# Patient Record
Sex: Male | Born: 1985 | Race: White | Hispanic: No | Marital: Single | Smoking: Former smoker
Health system: Southern US, Community
[De-identification: ages and names within clinical notes are randomized; demographics above are authoritative.]

---

## 2009-03-08 ENCOUNTER — Encounter: Admission: RE | Admit: 2009-03-08 | Discharge: 2009-03-30 | Payer: Self-pay | Admitting: Sports Medicine

## 2014-08-31 ENCOUNTER — Telehealth: Payer: Self-pay | Admitting: Emergency Medicine

## 2014-08-31 ENCOUNTER — Encounter: Payer: Self-pay | Admitting: *Deleted

## 2014-08-31 ENCOUNTER — Emergency Department (INDEPENDENT_AMBULATORY_CARE_PROVIDER_SITE_OTHER): Payer: Self-pay

## 2014-08-31 ENCOUNTER — Emergency Department
Admission: EM | Admit: 2014-08-31 | Discharge: 2014-08-31 | Disposition: A | Discharge status: Home / Self Care | Payer: Self-pay | Source: Home / Self Care | Attending: Family Medicine | Admitting: Family Medicine

## 2014-08-31 DIAGNOSIS — M545 Low back pain, unspecified: Secondary | ICD-10-CM

## 2014-08-31 MED ORDER — MELOXICAM 15 MG PO TABS
15.0000 mg | ORAL_TABLET | Freq: Every day | ORAL | Status: AC
Start: 2014-08-31 — End: ?

## 2014-08-31 MED ORDER — METAXALONE 800 MG PO TABS: 800.0000 mg | ORAL_TABLET | Freq: Three times a day (TID) | ORAL | Status: AC

## 2014-08-31 NOTE — Discharge Instructions (Signed)
Apply ice pack for 30 to 40 minutes, 3 to 4 times daily  Continue until pain decreases.   Avoid lifting.  Begin back range of motion and stretching exercises in about five days.   Back Exercises Back exercises help treat and prevent back injuries. The goal of back exercises is to increase the strength of your abdominal and back muscles and the flexibility of your back. These exercises should be started when you no longer have back pain. Back exercises include:  Pelvic Tilt. Lie on your back with your knees bent. Tilt your pelvis until the lower part of your back is against the floor. Hold this position 5 to 10 sec and repeat 5 to 10 times.  Knee to Chest. Pull first 1 knee up against your chest and hold for 20 to 30 seconds, repeat this with the other knee, and then both knees. This may be done with the other leg straight or bent, whichever feels better.  Sit-Ups or Curl-Ups. Bend your knees 90 degrees. Start with tilting your pelvis, and do a partial, slow sit-up, lifting your trunk only 30 to 45 degrees off the floor. Take at least 2 to 3 seconds for each sit-up. Do not do sit-ups with your knees out straight. If partial sit-ups are difficult, simply do the above but with only tightening your abdominal muscles and holding it as directed.  Hip-Lift. Lie on your back with your knees flexed 90 degrees. Push down with your feet and shoulders as you raise your hips a couple inches off the floor; hold for 10 seconds, repeat 5 to 10 times.  Back arches. Lie on your stomach, propping yourself up on bent elbows. Slowly press on your hands, causing an arch in your low back. Repeat 3 to 5 times. Any initial stiffness and discomfort should lessen with repetition over time.  Shoulder-Lifts. Lie face down with arms beside your body. Keep hips and torso pressed to floor as you slowly lift your head and shoulders off the floor. Do not overdo your exercises, especially in the beginning. Exercises may cause you  some mild back discomfort which lasts for a few minutes; however, if the pain is more severe, or lasts for more than 15 minutes, do not continue exercises until you see your caregiver. Improvement with exercise therapy for back problems is slow.  See your caregivers for assistance with developing a proper back exercise program. Document Released: 06/01/2004 Document Revised: 07/17/2011 Document Reviewed: 02/23/2011 [facility] Patient Information 2015 [employer]. This information is not intended to replace advice given to you by your health care provider. Make sure you discuss any questions you have with your health care provider.

## 2014-08-31 NOTE — ED Notes (Signed)
Pt c/o LBP x last night after bending over to move some laundry. He took flexeril last night with some relief. Denies injury.

## 2014-08-31 NOTE — ED Provider Notes (Signed)
CSN: [id number]     Arrival date & time 08/31/14  0900 History   First MD Initiated Contact with Patient 08/31/14 [phone]     Chief Complaint  Patient presents with  . Back Pain     HPI Comments: While bending over to move some laundry yesterday evening, patient felt sudden sharp pain in his lower back.  The pain intermittently radiates to his left posterior thigh and knee.  The pain is worse with any movement, and better when he arches his back, or supine in bed.   He denies bowel or bladder dysfunction, and no saddle numbness.    Patient is a 29 y.o. male presenting with back pain. The history is provided by the patient.  Back Pain Location:  Lumbar spine Quality:  Aching Radiates to:  L posterior upper leg Pain severity:  Severe Pain is:  Same all the time Onset quality:  Sudden Duration:  12 hours Timing:  Constant Progression:  Unchanged Chronicity:  New Context comment:  Bending over Relieved by:  Muscle relaxants and lying down (arching back) Worsened by:  Movement Ineffective treatments:  None tried Associated symptoms: tingling   Associated symptoms: no abdominal pain, no bladder incontinence, no bowel incontinence, no dysuria, no fever, no leg pain, no numbness, no paresthesias, no pelvic pain, no perianal numbness and no weakness   Risk factors: obesity     History reviewed. No pertinent past medical history. History reviewed. No pertinent past surgical history. Family History  Problem Relation Age of Onset  . ALS Father   . Diabetes Father    History  Substance Use Topics  . Smoking status: Former Games developer  . Smokeless tobacco: Not on file  . Alcohol Use: Yes     Comment: 1 q wk    Review of Systems  Constitutional: Negative for fever.  Gastrointestinal: Negative for abdominal pain and bowel incontinence.  Genitourinary: Negative for bladder incontinence, dysuria and pelvic pain.  Musculoskeletal: Positive for back pain.  Neurological: Positive for tingling.  Negative for weakness, numbness and paresthesias.  All other systems reviewed and are negative.   Allergies  Review of patient's allergies indicates no known allergies.  Home Medications   Prior to Admission medications   Medication Sig Start Date End Date Taking? Authorizing Provider  meloxicam (MOBIC) 15 MG tablet Take 1 tablet (15 mg total) by mouth daily. Take with food each morning 08/31/14   Lattie Haw, MD  metaxalone (SKELAXIN) 800 MG tablet Take 1 tablet (800 mg total) by mouth 3 (three) times daily. 08/31/14   Lattie Haw, MD   BP 120/75 mmHg  Pulse 94  Temp(Src) 98.1 F (36.7 C) (Oral)  Resp 18  Ht  (1.676 m)  Wt 212 lb (96.163 kg)  BMI 34.23 kg/m2  SpO2 97% Physical Exam  Constitutional: He is oriented to person, place, and time. He appears well-developed and well-nourished. No distress.  Patient is obese (BMI 34.2)  HENT:  Head: Normocephalic.  Eyes: Pupils are equal, round, and reactive to light.  Neck: Normal range of motion.  Cardiovascular: Normal heart sounds.   Pulmonary/Chest: Breath sounds normal.  Abdominal: There is no tenderness.  Musculoskeletal:       Lumbar back: He exhibits decreased range of motion, tenderness and pain. He exhibits no swelling and no edema.       Back:  Back:  Decreased range of motion.  Can heel/toe walk and squat without difficulty.  Tenderness in the midline and bilateral paraspinous  muscles from L1 to Sacral area as noted on diagram.    Straight leg raising test is negative.  Sitting knee extension test is negative.  Strength and sensation in the lower extremities is normal.  Patellar and achilles reflexes are normal   Lymphadenopathy:    He has no cervical adenopathy.  Neurological: He is alert and oriented to person, place, and time.  Skin: Skin is warm and dry. No rash noted.  Nursing note and vitals reviewed.   ED Course  Procedures  none  Imaging Review Dg Lumbar Spine Complete  08/31/2014   CLINICAL  DATA:  Low back pain beginning 08/30/2014 with some left-sided radiculopathy.  EXAM: LUMBAR SPINE - COMPLETE 4+ VIEW  COMPARISON:  None.  FINDINGS: Vertebral body height and alignment are maintained. Intervertebral disc space height is normal. No pars interarticularis defect is identified. Imaged paraspinous structures are unremarkable.  IMPRESSION: Negative exam.   Electronically Signed   By: Drusilla Kannerhomas  Dalessio M.D.   On: 08/31/2014 10:55     MDM   1. Bilateral low back pain without sciatica    Begin Mobic and Skelaxin. Apply ice pack for 30 to 40 minutes, 3 to 4 times daily  Continue until pain decreases.   Avoid lifting.  Begin back range of motion and stretching exercises in about five days. Followup with Dr. Rodney Langtonhomas Thekkekand[facility] ([facility]) if not improving about two weeks.     Lattie HawStephen A Vinson Tietze, MD 09/04/14 [phone]

## 2015-07-12 ENCOUNTER — Encounter: Payer: Self-pay | Admitting: Emergency Medicine

## 2015-07-12 ENCOUNTER — Emergency Department
Admission: EM | Admit: 2015-07-12 | Discharge: 2015-07-12 | Disposition: A | Discharge status: Home / Self Care | Payer: 59 | Source: Home / Self Care | Attending: Family Medicine | Admitting: Family Medicine

## 2015-07-12 DIAGNOSIS — K122 Cellulitis and abscess of mouth: Secondary | ICD-10-CM

## 2015-07-12 DIAGNOSIS — J039 Acute tonsillitis, unspecified: Secondary | ICD-10-CM | POA: Diagnosis not present

## 2015-07-12 LAB — POCT RAPID STREP A (OFFICE): Rapid Strep A Screen: NEGATIVE

## 2015-07-12 MED ORDER — CEFDINIR 300 MG PO CAPS: 300.0000 mg | ORAL_CAPSULE | Freq: Two times a day (BID) | ORAL | Status: AC

## 2015-07-12 MED ORDER — CEFTRIAXONE SODIUM 1 G IJ SOLR
1.0000 g | Freq: Once | INTRAMUSCULAR | Status: AC
  Administered 2015-07-12: 1 g via INTRAMUSCULAR

## 2015-07-12 MED ORDER — DEXAMETHASONE SODIUM PHOSPHATE 10 MG/ML IJ SOLN
10.0000 mg | Freq: Once | INTRAMUSCULAR | Status: AC
  Administered 2015-07-12: 10 mg via INTRAMUSCULAR

## 2015-07-12 MED ORDER — PREDNISONE 20 MG PO TABS: ORAL_TABLET | ORAL | Status: AC

## 2015-07-12 NOTE — Discharge Instructions (Signed)
You may take 400-600mg  Ibuprofen (Motrin) every 6-8 hours for fever and pain  Alternate with Tylenol  You may take 500mg  Tylenol every 4-6 hours as needed for fever and pain  Follow-up with your primary care provider next week for recheck of symptoms if not improving.  Be sure to drink plenty of fluids and rest, at least 8hrs of sleep a night, preferably more while you are sick. Return urgent care or go to closest ER if you cannot keep down fluids/signs of dehydration, fever not reducing with Tylenol, difficulty breathing/wheezing, stiff neck, worsening condition, or other concerns (see below)  Please take antibiotics as prescribed and be sure to complete entire course even if you start to feel better to ensure infection does not come back.  BE SURE TO ALSO THROW OUT YOUR TOOTHBRUSH AND GET A NEW ONE TO MAKE SURE YOU ARE NOT REINFECTING YOURSELF WITH THE SAME BACTERIA.

## 2015-07-12 NOTE — ED Provider Notes (Signed)
CSN: [id number]     Arrival date & time 07/12/15  [id number] History   First MD Initiated Contact with Patient 07/12/15 [phone]     Chief Complaint  Patient presents with  . Sore Throat   (Consider location/radiation/quality/duration/timing/severity/associated sxs/prior Treatment) HPI  [facility] 29[facility] and swollen uvula.  He reports hx of same about 2 weeks ago and completed a course of Penicillin.  Symptoms had resolved but then came back suddenly this morning. Pain is minimal, 2/10, but pt states it is harder to swallow due to swelling.  Denies difficulty breathing.  He is able to keep down fluids. Denies n/v/d. Denies fever or chills, cough or congestion. Denies recent travel or sick contacts.   History reviewed. No pertinent past medical history. History reviewed. No pertinent past surgical history. Family History  Problem Relation Age of Onset  . ALS Father   . Diabetes Father    Social History  Substance Use Topics  . Smoking status: Former Games developermoker  . Smokeless tobacco: None  . Alcohol Use: Yes     Comment: 1 q wk    Review of Systems  Constitutional: Negative for fever and chills.  HENT: Positive for sore throat ( redness and swelling of uvula). Negative for congestion, ear pain, trouble swallowing and voice change.   Respiratory: Negative for cough and shortness of breath.   Cardiovascular: Negative for chest pain and palpitations.  Gastrointestinal: Negative for nausea, vomiting, abdominal pain and diarrhea.  Musculoskeletal: Negative for myalgias, back pain and arthralgias.  Skin: Negative for rash.    Allergies  Review of patient's allergies indicates no known allergies.  Home Medications   Prior to Admission medications   Medication Sig Start Date End Date Taking? Authorizing Provider  cefdinir (OMNICEF) 300 MG capsule Take 1 capsule (300 mg total) by mouth 2 (two) times daily. For 10 days 07/12/15   Junius FinnerErin O'Malley, PA-C   meloxicam (MOBIC) 15 MG tablet Take 1 tablet (15 mg total) by mouth daily. Take with food each morning 08/31/14   Lattie HawStephen A Beese, MD  metaxalone (SKELAXIN) 800 MG tablet Take 1 tablet (800 mg total) by mouth 3 (three) times daily. 08/31/14   Lattie HawStephen A Beese, MD  predniSONE (DELTASONE) 20 MG tablet 2 tabs po daily x 3 days 07/12/15   Junius FinnerErin O'Malley, PA-C   Meds Ordered and Administered this Visit   Medications  cefTRIAXone (ROCEPHIN) injection 1 g (1 g Intramuscular Given 07/12/15 0906)  dexamethasone (DECADRON) injection 10 mg (10 mg Intramuscular Given 07/12/15 0906)    BP 115/76 mmHg  Pulse 68  Temp(Src) 97.8 F (36.6 C) (Oral)  Ht 5\' 6"  (1.676 m)  Wt 214 lb (97.07 kg)  BMI 34.56 kg/m2  SpO2 97% No data found.   Physical Exam  Constitutional: He appears well-developed and well-nourished.  HENT:  Head: Normocephalic and atraumatic.  Right Ear: Tympanic membrane normal.  Left Ear: Tympanic membrane normal.  Nose: Nose normal.  Mouth/Throat: Uvula is midline and mucous membranes are normal. No trismus in the jaw. Uvula swelling present. Posterior oropharyngeal edema and posterior oropharyngeal erythema present. No oropharyngeal exudate or tonsillar abscesses.  Mild edema of uvula with bilateral tonsillar edema and sores on tonsils.   Eyes: Conjunctivae are normal. No scleral icterus.  Neck: Normal range of motion. Neck supple.  Cardiovascular: Normal rate, regular rhythm and normal heart sounds.   Pulmonary/Chest: Effort normal and breath sounds normal. No stridor. No respiratory distress.  He has no wheezes. He has no rales. He exhibits no tenderness.  Abdominal: Soft. He exhibits no distension. There is no tenderness.  Musculoskeletal: Normal range of motion.  Lymphadenopathy:    He has cervical adenopathy.  Neurological: He is alert.  Skin: Skin is warm and dry.  Nursing note and vitals reviewed.   ED Course  Procedures (including critical care time)  Labs Review Labs  Reviewed - No data to display  Imaging Review No results found.    MDM   1. Uvulitis   2. Acute tonsillitis, unspecified etiology    Pt c/o sore throat with swelling of uvula and tonsils. Completed a course of PCN 2 weeks ago after testing positive for strep.  No evidence of peritonsillar abscess. No stridor. No respiratory distress. Rapid strep: negative Will send culture.   While culture is pending, will cover for bacterial cause given the swelling. Tx in UC: Rocephin 1g IM and Decadron  IM Rx: cefdinir and prednisone   Advised pt to use acetaminophen and ibuprofen as needed for fever and pain. Encouraged rest, fluids and saltwater gargles. F/u with PCP in 4-5 days if not improving, sooner if worsening.  Discussed symptoms that warrant emergent care in the ED.  Pt verbalized understanding and agreement with tx plan.     Junius Finner, PA-C 07/12/15 [phone]

## 2015-07-12 NOTE — ED Notes (Signed)
Woke up with swollen uvula, had same sx 2 weeks ago had pos strep

## 2015-07-13 LAB — STREP A DNA PROBE: GASP: NOT DETECTED

## 2015-07-14 ENCOUNTER — Telehealth: Payer: Self-pay

## 2015-07-15 ENCOUNTER — Telehealth: Payer: Self-pay

## 2015-09-07 IMAGING — CR DG LUMBAR SPINE COMPLETE 4+V
5 series · 5 of 5 positions shown · non-contrast
Comparison: None.

CLINICAL DATA: Low back pain beginning 08/30/2014 with some
left-sided radiculopathy.

EXAM:
LUMBAR SPINE - COMPLETE 4+ VIEW

[l-spine ap]
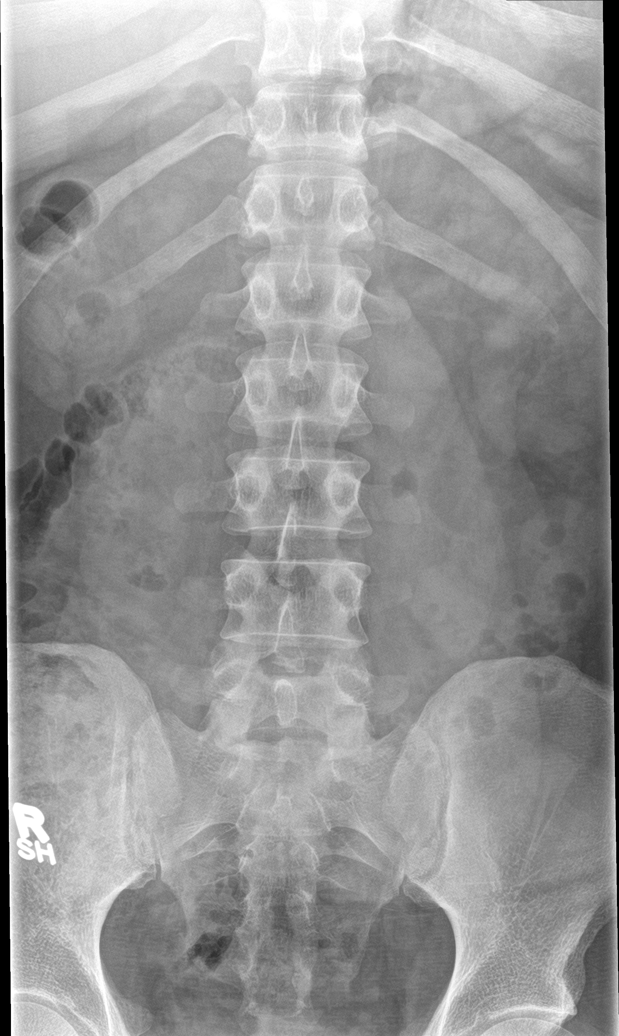

[l-spine obl (1 of 2)]
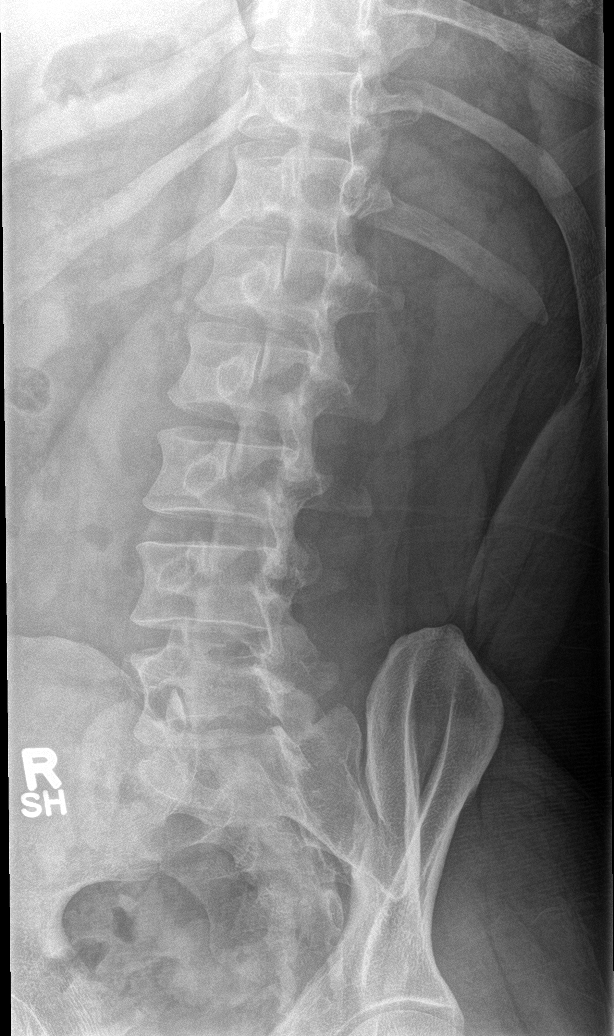

[l-spine obl (2 of 2)]
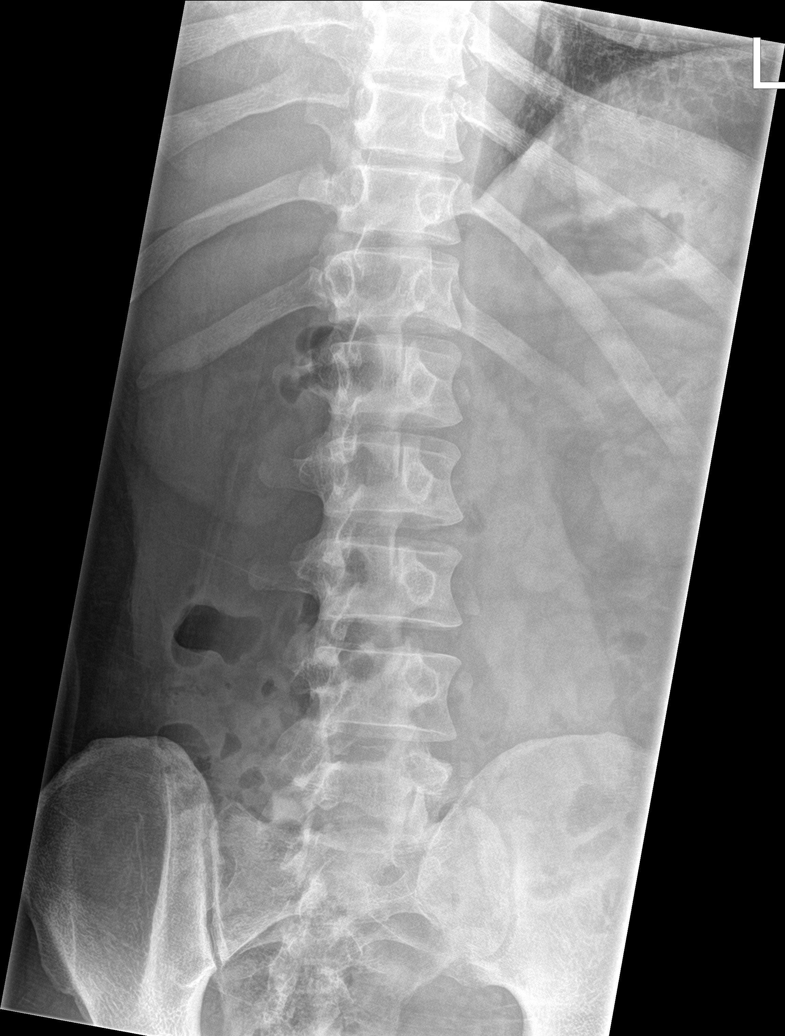

[l-spine lat]
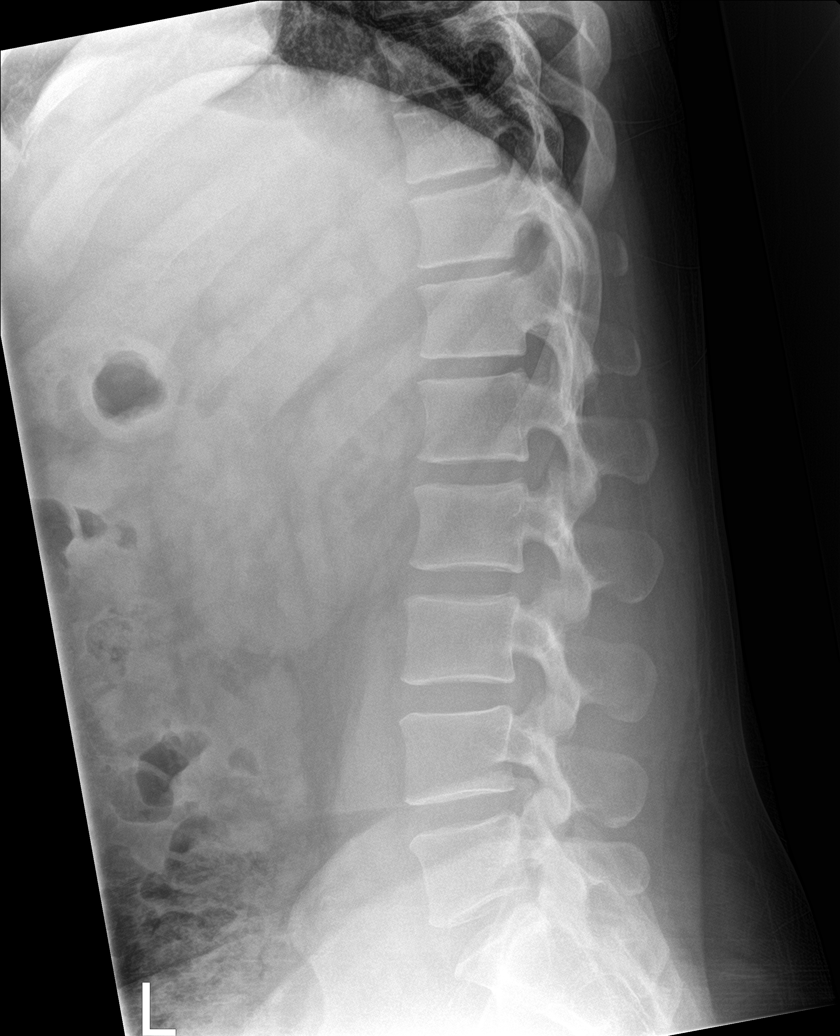

[l-spine spot]
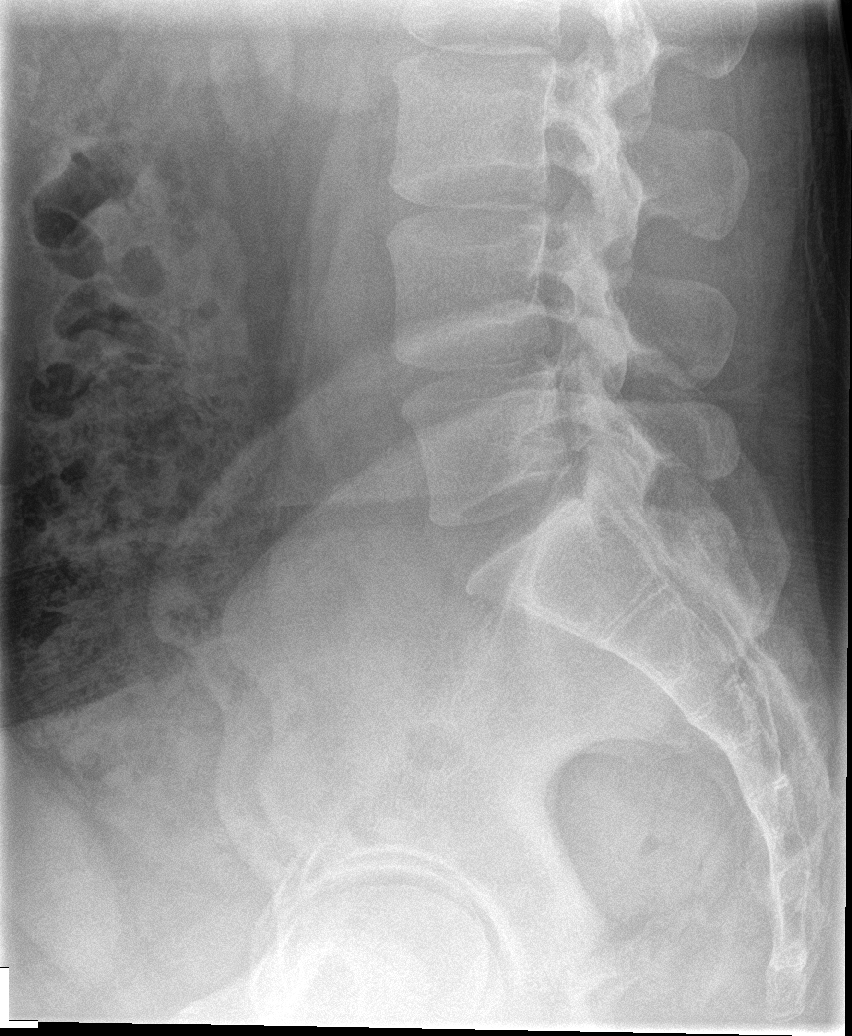

[5 of 5 positions shown; findings below may reference images not displayed]

FINDINGS: Vertebral body height and alignment are maintained. Intervertebral
disc space height is normal. No pars interarticularis defect is
identified. Imaged paraspinous structures are unremarkable.
IMPRESSION: Negative exam.

## 2019-10-01 ENCOUNTER — Ambulatory Visit: Payer: Self-pay | Attending: Internal Medicine

## 2019-10-01 DIAGNOSIS — Z23 Encounter for immunization: Secondary | ICD-10-CM

## 2019-10-01 NOTE — Progress Notes (Signed)
   Covid-19 Vaccination Clinic  Name:  Daniel Valdez    MRN: [id number] DOB: [birth year]  10/01/2019  [name] was observed post Covid-19 immunization for 15 minutes without incident. He was provided with Vaccine Information Sheet and instruction to access the V-Safe system.   [name] was instructed to call 911 with any severe reactions post vaccine: Marland Kitchen Difficulty breathing  . Swelling of face and throat  . A fast heartbeat  . A bad rash all over body  . Dizziness and weakness   Immunizations Administered    Name Date Dose VIS Date Route   Pfizer COVID-19 Vaccine 10/01/2019 11:50 AM 0.3 mL 07/02/2018 Intramuscular   Manufacturer: [employer], [employer]   Lot: [id number]   NDC: [address]-0

## 2019-10-27 ENCOUNTER — Ambulatory Visit: Payer: Self-pay | Attending: Internal Medicine

## 2019-10-27 DIAGNOSIS — Z23 Encounter for immunization: Secondary | ICD-10-CM

## 2019-10-27 NOTE — Progress Notes (Signed)
   Covid-19 Vaccination Clinic  Name:  Kaylen Nghiem    MRN: [id number] DOB: Oct 01, 1985  10/27/2019  Mr. Bassford was observed post Covid-19 immunization for 15 minutes without incident. He was provided with Vaccine Information Sheet and instruction to access the V-Safe system.   Mr. Orndoff was instructed to call 911 with any severe reactions post vaccine: Marland Kitchen Difficulty breathing  . Swelling of face and throat  . A fast heartbeat  . A bad rash all over body  . Dizziness and weakness   Immunizations Administered    Name Date Dose VIS Date Route   Pfizer COVID-19 Vaccine 10/27/2019  8:14 AM 0.3 mL 07/02/2018 Intramuscular   Manufacturer: [employer], [employer]   Lot: [id number]   NDC: [address]-5
